# Patient Record
Sex: Male | Born: 1990 | Race: White | Hispanic: No | Marital: Married | Smoking: Never smoker
Health system: Southern US, Community
[De-identification: ages and names within clinical notes are randomized; demographics above are authoritative.]

---

## 2016-07-11 ENCOUNTER — Emergency Department (INDEPENDENT_AMBULATORY_CARE_PROVIDER_SITE_OTHER)
Admission: EM | Admit: 2016-07-11 | Discharge: 2016-07-11 | Disposition: A | Discharge status: Home / Self Care | Payer: 59 | Source: Home / Self Care | Attending: Family Medicine | Admitting: Family Medicine

## 2016-07-11 DIAGNOSIS — M79602 Pain in left arm: Secondary | ICD-10-CM | POA: Diagnosis not present

## 2016-07-11 DIAGNOSIS — M79651 Pain in right thigh: Secondary | ICD-10-CM

## 2016-07-11 DIAGNOSIS — M5412 Radiculopathy, cervical region: Secondary | ICD-10-CM

## 2016-07-11 DIAGNOSIS — S29012A Strain of muscle and tendon of back wall of thorax, initial encounter: Secondary | ICD-10-CM

## 2016-07-11 MED ORDER — MELOXICAM 15 MG PO TABS: 15.0000 mg | ORAL_TABLET | Freq: Every day | ORAL | 0 refills | Status: DC

## 2016-07-11 MED ORDER — CYCLOBENZAPRINE HCL 10 MG PO TABS
10.0000 mg | ORAL_TABLET | Freq: Two times a day (BID) | ORAL | 0 refills | Status: DC | PRN
Start: 2016-07-11 — End: 2017-08-04

## 2016-07-11 NOTE — ED Triage Notes (Signed)
Pt was working out Friday night.  Saturday morning started having pain in the neck and shoulders, and was hard to move his head. Saturday and Sunday he was unable to move his neck and head.   Monday he started having aching pain down the left arm.   Today had aching pain down the right leg.

## 2016-07-11 NOTE — ED Provider Notes (Signed)
CSN: [id number]     Arrival date & time 07/11/16  1615 History   First MD Initiated Contact with Patient 07/11/16 1651     Chief Complaint  Patient presents with  . Neck Pain  . Leg Pain  . Arm Pain   (Consider location/radiation/quality/duration/timing/severity/associated sxs/prior Treatment) HPI Bobby Morton is a 26 y.o. male presenting to UC with c/o Left sided neck and upper back soreness and stiffness along with Left arm soreness and intermittent numbness that started about 4 days ago after working out in the gym and doing overhead lifts the night before symptoms started.  Two days ago he developed Right thigh pain. Denies known injury but notes he was lifting a little more than he usually does.  He has taken ibuprofen with some relief.      History reviewed. No pertinent past medical history. History reviewed. No pertinent surgical history. History reviewed. No pertinent family history. Social History  Substance Use Topics  . Smoking status: Never Smoker  . Smokeless tobacco: Never Used  . Alcohol use Yes    Review of Systems  Musculoskeletal: Positive for arthralgias, back pain, myalgias, neck pain and neck stiffness. Negative for gait problem and joint swelling.  Skin: Negative for color change and wound.  Neurological: Positive for numbness. Negative for weakness.    Allergies  Patient has no known allergies.  Home Medications   Prior to Admission medications   Medication Sig Start Date End Date Taking? Authorizing Provider  cyclobenzaprine (FLEXERIL) 10 MG tablet Take 1 tablet (10 mg total) by mouth 2 (two) times daily as needed. 07/11/16   Junius FinnerErin O'Malley, PA-C  meloxicam (MOBIC) 15 MG tablet Take 1 tablet (15 mg total) by mouth daily. 07/11/16   Junius FinnerErin O'Malley, PA-C   Meds Ordered and Administered this Visit  Medications - No data to display  BP 129/84 (BP Location: Left Arm)   Pulse 62   Temp 98.2 F (36.8 C) (Oral)   Ht 5\' 10"  (1.778 m)   Wt 165 lb (74.8 kg)    SpO2 98%   BMI 23.68 kg/m  No data found.   Physical Exam  Constitutional: He is oriented to person, place, and time. He appears well-developed and well-nourished. No distress.  HENT:  Head: Normocephalic and atraumatic.  Eyes: EOM are normal.  Neck: Normal range of motion. Neck supple.  No midline bone tenderness, no crepitus or step-offs.  Mild tenderness to Left side cervical muscles.  Cardiovascular: Normal rate.   Pulses:      Radial pulses are 2+ on the right side, and 2+ on the left side.  Pulmonary/Chest: Effort normal.  Musculoskeletal: Normal range of motion. He exhibits tenderness. He exhibits no edema.  No midline spinal tenderness. Tenderness to Left upper trapezius.  Full ROM upper and lower extremities. Negative straight leg raise. Tenderness to Right thigh.  5/5 strength in upper and lower extremities bilaterally.   Neurological: He is alert and oriented to person, place, and time.  Skin: Skin is warm and dry. Capillary refill takes less than 2 seconds. He is not diaphoretic. No erythema.  Psychiatric: He has a normal mood and affect. His behavior is normal.  Nursing note and vitals reviewed.   Urgent Care Course     Procedures (including critical care time)  Labs Review Labs Reviewed - No data to display  Imaging Review No results found.    MDM   1. Left arm pain   2. Left cervical radiculopathy   3. Right  thigh pain   4. Muscle strain of left upper back, initial encounter    Muscle tenderness noted on exam. No bony tenderness. No red flag symptoms.  Pain likely due to muscle strain from recent workout regimen 5 days ago. Rx: Flexeril and Mobic F/u with Sports Medicine in 1 week if not improving. Patient verbalized understanding and agreement with treatment plan.    Junius Finner, PA-C 07/11/16 1835

## 2016-07-11 NOTE — Discharge Instructions (Signed)
°  Flexeril is a muscle relaxer and may cause drowsiness. Do not drink alcohol, drive, or operate heavy machinery while taking. ° °Meloxicam (Mobic) is an antiinflammatory to help with pain and inflammation.  Do not take ibuprofen, Advil, Aleve, or any other medications that contain NSAIDs while taking meloxicam as this may cause stomach upset or even ulcers if taken in large amounts for an extended period of time.  ° °

## 2017-08-04 ENCOUNTER — Emergency Department (INDEPENDENT_AMBULATORY_CARE_PROVIDER_SITE_OTHER)
Admission: EM | Admit: 2017-08-04 | Discharge: 2017-08-04 | Disposition: A | Discharge status: Home / Self Care | Payer: 59 | Source: Home / Self Care

## 2017-08-04 ENCOUNTER — Encounter: Payer: Self-pay | Admitting: Emergency Medicine

## 2017-08-04 DIAGNOSIS — J0120 Acute ethmoidal sinusitis, unspecified: Secondary | ICD-10-CM

## 2017-08-04 MED ORDER — AMOXICILLIN 500 MG PO CAPS: 500.0000 mg | ORAL_CAPSULE | Freq: Three times a day (TID) | ORAL | 0 refills | Status: DC

## 2017-08-04 NOTE — ED Provider Notes (Signed)
[facility]    CSN: [id number] Arrival date & time: 08/04/17  1124     History   Chief Complaint Chief Complaint  Patient presents with  . URI    HPI Bobby Morton is a 27 y.o. male.   The history is provided by the patient. No language interpreter was used.  URI  Presenting symptoms: congestion, cough, facial pain and rhinorrhea   Severity:  Severe Onset quality:  Gradual Duration:  11 days Timing:  Constant Progression:  Worsening Chronicity:  New Relieved by:  Nothing Worsened by:  Nothing Ineffective treatments:  None tried Associated symptoms: headaches   Risk factors: sick contacts   Risk factors: no recent illness     History reviewed. No pertinent past medical history.  There are no active problems to display for this patient.   History reviewed. No pertinent surgical history.     Home Medications    Prior to Admission medications   Medication Sig Start Date End Date Taking? Authorizing Provider  amoxicillin (AMOXIL) 500 MG capsule Take 1 capsule (500 mg total) by mouth 3 (three) times daily. 08/04/17   Elson AreasSofia, Tashona Calk K, PA-C    Family History No family history on file.  Social History Social History   Tobacco Use  . Smoking status: Never Smoker  . Smokeless tobacco: Never Used  Substance Use Topics  . Alcohol use: Yes  . Drug use: No     Allergies   Patient has no known allergies.   Review of Systems Review of Systems  HENT: Positive for congestion and rhinorrhea.   Respiratory: Positive for cough.   Neurological: Positive for headaches.  All other systems reviewed and are negative.    Physical Exam Triage Vital Signs ED Triage Vitals  Enc Vitals Group     BP 08/04/17 1159 128/88     Pulse Rate 08/04/17 1159 69     Resp --      Temp 08/04/17 1159 98.2 F (36.8 C)     Temp Source 08/04/17 1159 Oral     SpO2 08/04/17 1159 100 %     Weight 08/04/17 1200 168 lb (76.2 kg)     Height 08/04/17 1200 5' 10.5"  (1.791 m)     Head Circumference --      Peak Flow --      Pain Score 08/04/17 1200 7     Pain Loc --      Pain Edu? --      Excl. in GC? --    No data found.  Updated Vital Signs BP 128/88 (BP Location: Right Arm)   Pulse 69   Temp 98.2 F (36.8 C) (Oral)   Ht 5' 10.5" (1.791 m)   Wt 168 lb (76.2 kg)   SpO2 100%   BMI 23.76 kg/m   Visual Acuity Right Eye Distance:   Left Eye Distance:   Bilateral Distance:    Right Eye Near:   Left Eye Near:    Bilateral Near:     Physical Exam  Constitutional: He appears well-developed and well-nourished.  HENT:  Head: Normocephalic and atraumatic.  Right Ear: External ear normal.  Left Ear: External ear normal.  Mouth/Throat: Oropharynx is clear and moist.  Tender frontal and maxillary sinuses  Eyes: Conjunctivae are normal.  Neck: Neck supple.  Cardiovascular: Normal rate and regular rhythm.  No murmur heard. Pulmonary/Chest: Effort normal and breath sounds normal. No respiratory distress.  Abdominal: Soft. There is no tenderness.  Musculoskeletal: He exhibits  no edema.  Neurological: He is alert.  Skin: Skin is warm and dry.  Psychiatric: He has a normal mood and affect.  Nursing note and vitals reviewed.    UC Treatments / Results  Labs (all labs ordered are listed, but only abnormal results are displayed) Labs Reviewed - No data to display  EKG None Radiology No results found.  Procedures Procedures (including critical care time)  Medications Ordered in UC Medications - No data to display   Initial Impression / Assessment and Plan / UC Course  I have reviewed the triage vital signs and the nursing notes.  Pertinent labs & imaging results that were available during my care of the patient were reviewed by me and considered in my medical decision making (see chart for details).       Final Clinical Impressions(s) / UC Diagnoses   Final diagnoses:  Acute ethmoidal sinusitis, recurrence not specified      ED Discharge Orders        Ordered    amoxicillin (AMOXIL) 500 MG capsule  3 times daily     08/04/17 1218     An After Visit Summary was printed and given to the patient.   Controlled Substance Prescriptions [place] Controlled Substance Registry consulted? Not Applicable   Elson Areas, [place] 08/04/17 1220

## 2017-08-04 NOTE — Discharge Instructions (Addendum)
Return if any problems.

## 2017-08-04 NOTE — ED Triage Notes (Signed)
Patient complaining of productive cough, runny nose, drainage in ears, scratchy throat x 11 days, HA

## 2017-08-12 ENCOUNTER — Emergency Department (INDEPENDENT_AMBULATORY_CARE_PROVIDER_SITE_OTHER)
Admission: EM | Admit: 2017-08-12 | Discharge: 2017-08-12 | Disposition: A | Discharge status: Home / Self Care | Payer: 59 | Source: Home / Self Care | Attending: Family Medicine | Admitting: Family Medicine

## 2017-08-12 ENCOUNTER — Encounter: Payer: Self-pay | Admitting: *Deleted

## 2017-08-12 ENCOUNTER — Other Ambulatory Visit: Payer: Self-pay

## 2017-08-12 ENCOUNTER — Emergency Department (INDEPENDENT_AMBULATORY_CARE_PROVIDER_SITE_OTHER): Payer: 59

## 2017-08-12 DIAGNOSIS — R509 Fever, unspecified: Secondary | ICD-10-CM

## 2017-08-12 DIAGNOSIS — R69 Illness, unspecified: Secondary | ICD-10-CM | POA: Diagnosis not present

## 2017-08-12 DIAGNOSIS — R51 Headache: Secondary | ICD-10-CM

## 2017-08-12 DIAGNOSIS — R05 Cough: Secondary | ICD-10-CM | POA: Diagnosis not present

## 2017-08-12 DIAGNOSIS — J111 Influenza due to unidentified influenza virus with other respiratory manifestations: Secondary | ICD-10-CM

## 2017-08-12 MED ORDER — OSELTAMIVIR PHOSPHATE 75 MG PO CAPS: 75.0000 mg | ORAL_CAPSULE | Freq: Two times a day (BID) | ORAL | 0 refills | Status: DC

## 2017-08-12 MED ORDER — IBUPROFEN 600 MG PO TABS
600.0000 mg | ORAL_TABLET | Freq: Once | ORAL | Status: AC
  Administered 2017-08-12: 600 mg via ORAL

## 2017-08-12 NOTE — ED Provider Notes (Signed)
[facility]    CSN: [id number] Arrival date & time: 08/12/17  1049     History   Chief Complaint Chief Complaint  Patient presents with  . Cough  . Generalized Body Aches    HPI Bobby Morton is a 27 y.o. male.   Patient was evaluated and treated for a respiratory infection about 18 days ago and prescribed amoxicillin.  He improved, but suddenly last night developed myalgias, chills, sweats, fatigue, non-productive cough, and increased sinus drainage.  The history is provided by the patient.    History reviewed. No pertinent past medical history.  There are no active problems to display for this patient.   History reviewed. No pertinent surgical history.     Home Medications    Prior to Admission medications   Medication Sig Start Date End Date Taking? Authorizing Provider  amoxicillin (AMOXIL) 500 MG capsule Take 1 capsule (500 mg total) by mouth 3 (three) times daily. 08/04/17   Elson AreasSofia, Leslie K, PA-C  oseltamivir (TAMIFLU) 75 MG capsule Take 1 capsule (75 mg total) by mouth every 12 (twelve) hours. 08/12/17   Lattie HawBeese, Nile Prisk A, MD    Family History History reviewed. No pertinent family history.  Social History Social History   Tobacco Use  . Smoking status: Never Smoker  . Smokeless tobacco: Never Used  Substance Use Topics  . Alcohol use: Yes  . Drug use: No     Allergies   Patient has no known allergies.   Review of Systems Review of Systems No sore throat + cough No pleuritic pain No wheezing + nasal congestion + post-nasal drainage No sinus pain/pressure No itchy/red eyes No earache No hemoptysis No SOB ? fever, + chills/sweats No nausea No vomiting No abdominal pain No diarrhea No urinary symptoms No skin rash + fatigue + myalgias + headache Used OTC meds without relief   Physical Exam Triage Vital Signs ED Triage Vitals [08/12/17 1226]  Enc Vitals Group     BP 130/81     Pulse Rate 76     Resp 16     Temp 99  F (37.2 C)     Temp Source Oral     SpO2 100 %     Weight 171 lb (77.6 kg)     Height      Head Circumference      Peak Flow      Pain Score 8     Pain Loc      Pain Edu?      Excl. in GC?    No data found.  Updated Vital Signs BP 130/81 (BP Location: Right Arm)   Pulse 76   Temp 99 F (37.2 C) (Oral)   Resp 16   Wt 171 lb (77.6 kg)   SpO2 100%   BMI 24.19 kg/m   Visual Acuity Right Eye Distance:   Left Eye Distance:   Bilateral Distance:    Right Eye Near:   Left Eye Near:    Bilateral Near:     Physical Exam Nursing notes and Vital Signs reviewed. Appearance:  Patient appears stated age, and in no acute distress Eyes:  Pupils are equal, round, and reactive to light and accomodation.  Extraocular movement is intact.  Conjunctivae are not inflamed  Ears:  Canals normal.  Tympanic membranes normal.  Nose:  Mildly congested turbinates.  No sinus tenderness.   Pharynx:  Normal Neck:  Supple.  Enlarged posterior/lateral nodes are palpated bilaterally, tender to palpation on the  left.   Lungs:  Clear to auscultation.  Breath sounds are equal.  Moving air well. Heart:  Regular rate and rhythm without murmurs, rubs, or gallops.  Abdomen:  Nontender without masses or hepatosplenomegaly.  Bowel sounds are present.  No CVA or flank tenderness.  Extremities:  No edema.  Skin:  No rash present.    UC Treatments / Results  Labs (all labs ordered are listed, but only abnormal results are displayed) Labs Reviewed - No data to display  EKG None Radiology Dg Chest 2 View  Result Date: 08/12/2017 CLINICAL DATA:  27 y/o M; cough x 18 days; improved with ABT; developed chills, cough and HA. EXAM: CHEST - 2 VIEW COMPARISON:  None. FINDINGS: The heart size and mediastinal contours are within normal limits. Both lungs are clear. The visualized skeletal structures are unremarkable. IMPRESSION: No active cardiopulmonary disease. Electronically Signed   By: Mitzi Hansen  M.D.   On: 08/12/2017 13:30    Procedures Procedures (including critical care time)  Medications Ordered in UC Medications  ibuprofen (ADVIL,MOTRIN) tablet 600 mg (600 mg Oral Given 08/12/17 1229)     Initial Impression / Assessment and Plan / UC Course  I have reviewed the triage vital signs and the nursing notes.  Pertinent labs & imaging results that were available during my care of the patient were reviewed by me and considered in my medical decision making (see chart for details).    Negative chest x-ray reassuring. There is no evidence of bacterial infection today.  Begin empiric Tamiflu. Take plain guaifenesin (1200mg  extended release tabs such as Mucinex) twice daily, with plenty of water, for cough and congestion.  May add Pseudoephedrine (30mg , one or two every 4 to 6 hours) for sinus congestion.  Get adequate rest.   May take Delsym Cough Suppressant at bedtime for nighttime cough.  Try warm salt water gargles for sore throat.  Stop all antihistamines for now, and other non-prescription cough/cold preparations. May take Ibuprofen 200mg , 4 tabs every 8 hours with food for fever, headache, body aches, etc. Followup with Family Doctor if not improved in one week.     Final Clinical Impressions(s) / UC Diagnoses   Final diagnoses:  Influenza-like illness    ED Discharge Orders        Ordered    oseltamivir (TAMIFLU) 75 MG capsule  Every 12 hours     08/12/17 1403        Lattie Haw, MD 08/19/17 1414

## 2017-08-12 NOTE — Discharge Instructions (Addendum)
Take plain guaifenesin (1200mg  extended release tabs such as Mucinex) twice daily, with plenty of water, for cough and congestion.  May add Pseudoephedrine (30mg , one or two every 4 to 6 hours) for sinus congestion.  Get adequate rest.   May take Delsym Cough Suppressant at bedtime for nighttime cough.  Try warm salt water gargles for sore throat.  Stop all antihistamines for now, and other non-prescription cough/cold preparations. May take Ibuprofen 200mg , 4 tabs every 8 hours with food for fever, headache, body aches, etc.

## 2017-08-12 NOTE — ED Triage Notes (Signed)
Patient was seen 1 week ago and given Amox. Taken as directed and improved. Last night developed chills, aches HA and dry cough.

## 2017-11-06 ENCOUNTER — Other Ambulatory Visit: Payer: Self-pay

## 2017-11-06 ENCOUNTER — Emergency Department (INDEPENDENT_AMBULATORY_CARE_PROVIDER_SITE_OTHER)
Admission: EM | Admit: 2017-11-06 | Discharge: 2017-11-06 | Disposition: A | Discharge status: Home / Self Care | Payer: 59 | Source: Home / Self Care

## 2017-11-06 ENCOUNTER — Encounter: Payer: Self-pay | Admitting: *Deleted

## 2017-11-06 DIAGNOSIS — J029 Acute pharyngitis, unspecified: Secondary | ICD-10-CM | POA: Diagnosis not present

## 2017-11-06 LAB — POCT RAPID STREP A (OFFICE): Rapid Strep A Screen: NEGATIVE

## 2017-11-06 NOTE — ED Triage Notes (Signed)
Pt c/o sore throat and white spot on throat x 10 days. He also reports intermittent episodes of his heart fluttering since the sore throat began.

## 2017-11-06 NOTE — Discharge Instructions (Signed)
Schedule appointment with primary care for recheck.  Warm salt water gargles, Elevate head of bed.  Try mylanta.

## 2017-11-07 ENCOUNTER — Telehealth: Payer: Self-pay

## 2017-11-07 LAB — STREP A DNA PROBE: Group A Strep Probe: NOT DETECTED

## 2017-11-07 NOTE — ED Provider Notes (Signed)
[facility]    CSN: [id number] Arrival date & time: 11/06/17  1740     History   Chief Complaint Chief Complaint  Patient presents with  . Sore Throat    HPI Bobby Morton is a 27 y.o. male.   The history is provided by the patient. No language interpreter was used.  Sore Throat  This is a new problem. The current episode started more than 1 week ago. The problem occurs constantly. The problem has not changed since onset.Pertinent negatives include no chest pain and no abdominal pain. Nothing aggravates the symptoms. Nothing relieves the symptoms. He has tried nothing for the symptoms. The treatment provided no relief.  Pt denies fever, no cough, no runny nose.    History reviewed. No pertinent past medical history.  There are no active problems to display for this patient.   History reviewed. No pertinent surgical history.     Home Medications    Prior to Admission medications   Not on File    Family History History reviewed. No pertinent family history.  Social History Social History   Tobacco Use  . Smoking status: Never Smoker  . Smokeless tobacco: Never Used  Substance Use Topics  . Alcohol use: Yes  . Drug use: No     Allergies   Patient has no known allergies.   Review of Systems Review of Systems  Cardiovascular: Negative for chest pain.  Gastrointestinal: Negative for abdominal pain.  All other systems reviewed and are negative.    Physical Exam Triage Vital Signs ED Triage Vitals  Enc Vitals Group     BP 11/06/17 1757 (!) 146/78     Pulse Rate 11/06/17 1757 75     Resp 11/06/17 1757 16     Temp 11/06/17 1757 98.4 F (36.9 C)     Temp Source 11/06/17 1757 Oral     SpO2 11/06/17 1757 99 %     Weight 11/06/17 1758 181 lb (82.1 kg)     Height 11/06/17 1758 5\' 10"  (1.778 m)     Head Circumference --      Peak Flow --      Pain Score 11/06/17 1758 0     Pain Loc --      Pain Edu? --      Excl. in GC? --    No data  found.  Updated Vital Signs BP (!) 146/78 (BP Location: Right Arm)   Pulse 75   Temp 98.4 F (36.9 C) (Oral)   Resp 16   Ht 5\' 10"  (1.778 m)   Wt 181 lb (82.1 kg)   SpO2 99%   BMI 25.97 kg/m   Visual Acuity Right Eye Distance:   Left Eye Distance:   Bilateral Distance:    Right Eye Near:   Left Eye Near:    Bilateral Near:     Physical Exam  Constitutional: He appears well-developed and well-nourished.  HENT:  Head: Normocephalic and atraumatic.  Right Ear: Tympanic membrane normal.  Left Ear: Tympanic membrane normal.  Mouth/Throat: Mucous membranes are normal. Posterior oropharyngeal erythema present.  Eyes: Pupils are equal, round, and reactive to light. Conjunctivae are normal.  Neck: Neck supple.  Cardiovascular: Normal rate and regular rhythm.  No murmur heard. Pulmonary/Chest: Effort normal and breath sounds normal. No respiratory distress.  Abdominal: Soft. There is no tenderness.  Musculoskeletal: He exhibits no edema.  Neurological: He is alert.  Skin: Skin is warm and dry.  Psychiatric: He has a normal  mood and affect.  Nursing note and vitals reviewed.    UC Treatments / Results  Labs (all labs ordered are listed, but only abnormal results are displayed) Labs Reviewed  STREP A DNA PROBE  POCT RAPID STREP A (OFFICE)    EKG None  Radiology No results found.  Procedures Procedures (including critical care time)  Medications Ordered in UC Medications - No data to display  Initial Impression / Assessment and Plan / UC Course  I have reviewed the triage vital signs and the nursing notes.  Pertinent labs & imaging results that were available during my care of the patient were reviewed by me and considered in my medical decision making (see chart for details).   MDM  Strep screen if negative,  Pt advised symptomatic care.  Follow up with primary for recheck if symptoms persist    Final Clinical Impressions(s) / UC Diagnoses   Final  diagnoses:  Pharyngitis, unspecified etiology     Discharge Instructions     Schedule appointment with primary care for recheck.  Warm salt water gargles, Elevate head of bed.  Try mylanta.    ED Prescriptions    None    An After Visit Summary was printed and given to the patient. Controlled Substance Prescriptions [place] Controlled Substance Registry consulted? Not Applicable   Elson Areas, [place] 11/07/17 1341

## 2017-11-07 NOTE — Telephone Encounter (Signed)
Left VM with lab results and to call UC if any questions or concern.

## 2018-08-18 ENCOUNTER — Ambulatory Visit: Payer: 59 | Admitting: Osteopathic Medicine

## 2018-10-15 ENCOUNTER — Encounter: Payer: Self-pay | Admitting: Emergency Medicine

## 2018-10-15 ENCOUNTER — Other Ambulatory Visit: Payer: Self-pay

## 2018-10-15 ENCOUNTER — Emergency Department (INDEPENDENT_AMBULATORY_CARE_PROVIDER_SITE_OTHER)
Admission: EM | Admit: 2018-10-15 | Discharge: 2018-10-15 | Disposition: A | Discharge status: Home / Self Care | Payer: 59 | Source: Home / Self Care

## 2018-10-15 ENCOUNTER — Telehealth: Payer: Self-pay | Admitting: Emergency Medicine

## 2018-10-15 DIAGNOSIS — M545 Low back pain, unspecified: Secondary | ICD-10-CM

## 2018-10-15 DIAGNOSIS — R531 Weakness: Secondary | ICD-10-CM | POA: Diagnosis not present

## 2018-10-15 DIAGNOSIS — R197 Diarrhea, unspecified: Secondary | ICD-10-CM

## 2018-10-15 LAB — POCT URINALYSIS DIP (MANUAL ENTRY)
Bilirubin, UA: NEGATIVE
Blood, UA: NEGATIVE
Glucose, UA: NEGATIVE mg/dL
Ketones, POC UA: NEGATIVE mg/dL
Leukocytes, UA: NEGATIVE
Nitrite, UA: NEGATIVE
Protein Ur, POC: NEGATIVE mg/dL
Spec Grav, UA: 1.01 (ref 1.010–1.025)
Urobilinogen, UA: 0.2 E.U./dL
pH, UA: 7 (ref 5.0–8.0)

## 2018-10-15 LAB — POCT CBC W AUTO DIFF (K'VILLE URGENT CARE)

## 2018-10-15 MED ORDER — FAMOTIDINE 20 MG PO TABS
20.0000 mg | ORAL_TABLET | Freq: Once | ORAL | Status: AC
  Administered 2018-10-15: 20 mg via ORAL

## 2018-10-15 MED ORDER — AZITHROMYCIN 250 MG PO TABS: 500.0000 mg | ORAL_TABLET | Freq: Every day | ORAL | 0 refills | Status: AC

## 2018-10-15 NOTE — Telephone Encounter (Signed)
As per Andrey Campanile at [place] submit [place] top for [employer], in the past we have always submitted a sterile urine container. I advised the patient to put stool in both. He acknowledges under standing

## 2018-10-15 NOTE — ED Triage Notes (Signed)
Diarrhea x 2 days, cramps, sensitive skin, fatigue, slight headache, indigestion

## 2018-10-15 NOTE — ED Provider Notes (Signed)
[facility]    CSN: [id number] Arrival date & time: 10/15/18  1217     History   Chief Complaint Chief Complaint  Patient presents with  . Diarrhea    HPI Bobby Morton is a 28 y.o. male.   HPI  Bobby Morton is a 28 y.o. male presenting to UC with c/o mild diffuse abdominal cramping with generalized fatigue for about 3-4 days and gradually worsening diarrhea the last 2-3 days.  Yesterday he reports "over 20 episodes" of watery diarrhea w/o blood or mucous in the stool. This morning he has already had about 5 episodes of diarrhea.  Abdominal cramping comes in waves and improves after he has a bowel movement but never completely resolves. Denies fever, chills, nausea or vomiting but does have a mild headache, bilateral low back pain and indigestion.  No medications tried PTA. No known sick contacts or recent travel. No recent antibiotic use. He cannot recall any bad food he has eaten.    History reviewed. No pertinent past medical history.  There are no active problems to display for this patient.   History reviewed. No pertinent surgical history.     Home Medications    Prior to Admission medications   Medication Sig Start Date End Date Taking? Authorizing Provider  azithromycin (ZITHROMAX) 250 MG tablet Take 2 tablets (500 mg total) by mouth daily for 3 days. Take with food 10/15/18 10/18/18  Lurene Shadow, PA-C    Family History Family History  Problem Relation Age of Onset  . Healthy Mother   . Healthy Father     Social History Social History   Tobacco Use  . Smoking status: Never Smoker  . Smokeless tobacco: Never Used  Substance Use Topics  . Alcohol use: Yes  . Drug use: No     Allergies   Patient has no known allergies.   Review of Systems Review of Systems  Constitutional: Negative for chills and fever.  HENT: Negative for congestion, ear pain, sore throat, trouble swallowing and voice change.   Respiratory: Negative for cough and  shortness of breath.   Cardiovascular: Negative for chest pain and palpitations.  Gastrointestinal: Positive for abdominal pain and diarrhea. Negative for nausea and vomiting.  Musculoskeletal: Positive for back pain. Negative for arthralgias and myalgias.  Skin: Negative for rash.  Neurological: Positive for weakness and headaches. Negative for dizziness, syncope and light-headedness.     Physical Exam Triage Vital Signs ED Triage Vitals  Enc Vitals Group     BP 10/15/18 1229 128/85     Pulse Rate 10/15/18 1229 82     Resp --      Temp 10/15/18 1229 98.9 F (37.2 C)     Temp Source 10/15/18 1229 Oral     SpO2 10/15/18 1229 99 %     Weight 10/15/18 1230 180 lb (81.6 kg)     Height 10/15/18 1230 5\' 10"  (1.778 m)     Head Circumference --      Peak Flow --      Pain Score 10/15/18 1229 4     Pain Loc --      Pain Edu? --      Excl. in GC? --    No data found.  Updated Vital Signs BP 128/85 (BP Location: Right Arm)   Pulse 82   Temp 98.9 F (37.2 C) (Oral)   Ht 5\' 10"  (1.778 m)   Wt 180 lb (81.6 kg)   SpO2 99%  BMI 25.83 kg/m   Visual Acuity Right Eye Distance:   Left Eye Distance:   Bilateral Distance:    Right Eye Near:   Left Eye Near:    Bilateral Near:     Physical Exam Vitals signs and nursing note reviewed.  Constitutional:      Appearance: Normal appearance. He is well-developed.  HENT:     Head: Normocephalic and atraumatic.     Right Ear: Tympanic membrane normal.     Left Ear: Tympanic membrane normal.     Nose: Nose normal.     Mouth/Throat:     Lips: Pink.     Mouth: Mucous membranes are moist.     Pharynx: Oropharynx is clear. Uvula midline.  Neck:     Musculoskeletal: Normal range of motion.  Cardiovascular:     Rate and Rhythm: Normal rate and regular rhythm.  Pulmonary:     Effort: Pulmonary effort is normal.     Breath sounds: Normal breath sounds.  Abdominal:     General: There is no distension.     Palpations: Abdomen is soft.      Tenderness: There is no abdominal tenderness. There is no right CVA tenderness or left CVA tenderness.  Musculoskeletal: Normal range of motion.  Skin:    General: Skin is warm and dry.  Neurological:     Mental Status: He is alert and oriented to person, place, and time.  Psychiatric:        Behavior: Behavior normal.      UC Treatments / Results  Labs (all labs ordered are listed, but only abnormal results are displayed) Labs Reviewed  COMPLETE METABOLIC PANEL WITH GFR  GASTROINTESTINAL PATHOGEN PANEL PCR  POCT CBC W AUTO DIFF ([facility])  POCT URINALYSIS DIP (MANUAL ENTRY)    EKG None  Radiology No results found.  Procedures Procedures (including critical care time)  Medications Ordered in UC Medications  famotidine (PEPCID) tablet 20 mg (20 mg Oral Given 10/15/18 1304)    Initial Impression / Assessment and Plan / UC Course  I have reviewed the triage vital signs and the nursing notes.  Pertinent labs & imaging results that were available during my care of the patient were reviewed by me and considered in my medical decision making (see chart for details).     Suspect infectious diarrhea. Benign abdominal exam. No evidence of emergent process taking place at this time.  Pt unable to provide stool sample in clinic, will have him return as soon as he can. Home care info provided. Discussed symptoms that warrant emergent care in the ED.  Final Clinical Impressions(s) / UC Diagnoses   Final diagnoses:  Diarrhea of presumed infectious origin  Generalized weakness  Acute bilateral low back pain without sciatica     Discharge Instructions      Be sure to get a lot of rest and stay well hydrated with sports drinks, water, diluted juices, and clear sodas.  Avoid fried fatty food, spicy food, and milk as these foods can cause worsening stomach upset.   Please follow up with family medicine in 3-4 days if not improving.  Call 911 or have someone  drive you to the hospital if symptoms significantly worsening- severe abdominal pain, blood in stool, dizziness/passing out, or other new concerning symptoms develop.    ED Prescriptions    Medication Sig Dispense Auth. Provider   azithromycin (ZITHROMAX) 250 MG tablet Take 2 tablets (500 mg total) by mouth daily for 3 days.  Take with food 6 tablet Lurene ShadowPhelps, Batsheva Stevick O, PA-C     Controlled Substance Prescriptions [place] Controlled Substance Registry consulted? Not Applicable   Rolla Platehelps, Jayesh Marbach O, PA-C 10/15/18 1419

## 2018-10-15 NOTE — Discharge Instructions (Signed)
°  Be sure to get a lot of rest and stay well hydrated with sports drinks, water, diluted juices, and clear sodas.  Avoid fried fatty food, spicy food, and milk as these foods can cause worsening stomach upset.   Please follow up with family medicine in 3-4 days if not improving.  Call 911 or have someone drive you to the hospital if symptoms significantly worsening- severe abdominal pain, blood in stool, dizziness/passing out, or other new concerning symptoms develop.

## 2018-10-16 LAB — COMPLETE METABOLIC PANEL WITH GFR
AG Ratio: 1.5 (calc) (ref 1.0–2.5)
ALT: 44 U/L (ref 9–46)
AST: 25 U/L (ref 10–40)
Albumin: 4.5 g/dL (ref 3.6–5.1)
Alkaline phosphatase (APISO): 52 U/L (ref 36–130)
BUN: 14 mg/dL (ref 7–25)
CO2: 26 mmol/L (ref 20–32)
Calcium: 9.2 mg/dL (ref 8.6–10.3)
Chloride: 104 mmol/L (ref 98–110)
Creat: 1 mg/dL (ref 0.60–1.35)
GFR, Est African American: 117 mL/min/{1.73_m2} (ref >=60)
GFR, Est Non African American: 101 mL/min/{1.73_m2} (ref >=60)
Globulin: 3 g/dL (calc) (ref 1.9–3.7)
Glucose, Bld: 86 mg/dL (ref 65–99)
Potassium: 4 mmol/L (ref 3.5–5.3)
Sodium: 140 mmol/L (ref 135–146)
Total Bilirubin: 0.6 mg/dL (ref 0.2–1.2)
Total Protein: 7.5 g/dL (ref 6.1–8.1)

## 2018-10-16 LAB — GASTROINTESTINAL PATHOGEN PANEL PCR
C. difficile Tox A/B, PCR: NOT DETECTED
Campylobacter, PCR: NOT DETECTED
Cryptosporidium, PCR: NOT DETECTED
E coli (ETEC) LT/ST PCR: NOT DETECTED
E coli (STEC) stx1/stx2, PCR: NOT DETECTED
E coli 0157, PCR: NOT DETECTED
Giardia lamblia, PCR: NOT DETECTED
Norovirus, PCR: NOT DETECTED
Rotavirus A, PCR: NOT DETECTED
Salmonella, PCR: NOT DETECTED
Shigella, PCR: NOT DETECTED

## 2018-10-17 ENCOUNTER — Telehealth: Payer: Self-pay

## 2018-10-17 NOTE — Telephone Encounter (Signed)
Spoke with patient and gave negative results of stool culture.  Still having diarrhea, encouraged to follow up with PCP.

## 2019-06-02 IMAGING — DX DG CHEST 2V
2 series · 2 of 2 positions shown · non-contrast
Comparison: None.

CLINICAL DATA: 27 y/o M; cough x 18 days; improved with ABT;
developed chills, cough and HA.

EXAM:
CHEST - 2 VIEW

[chest pa]
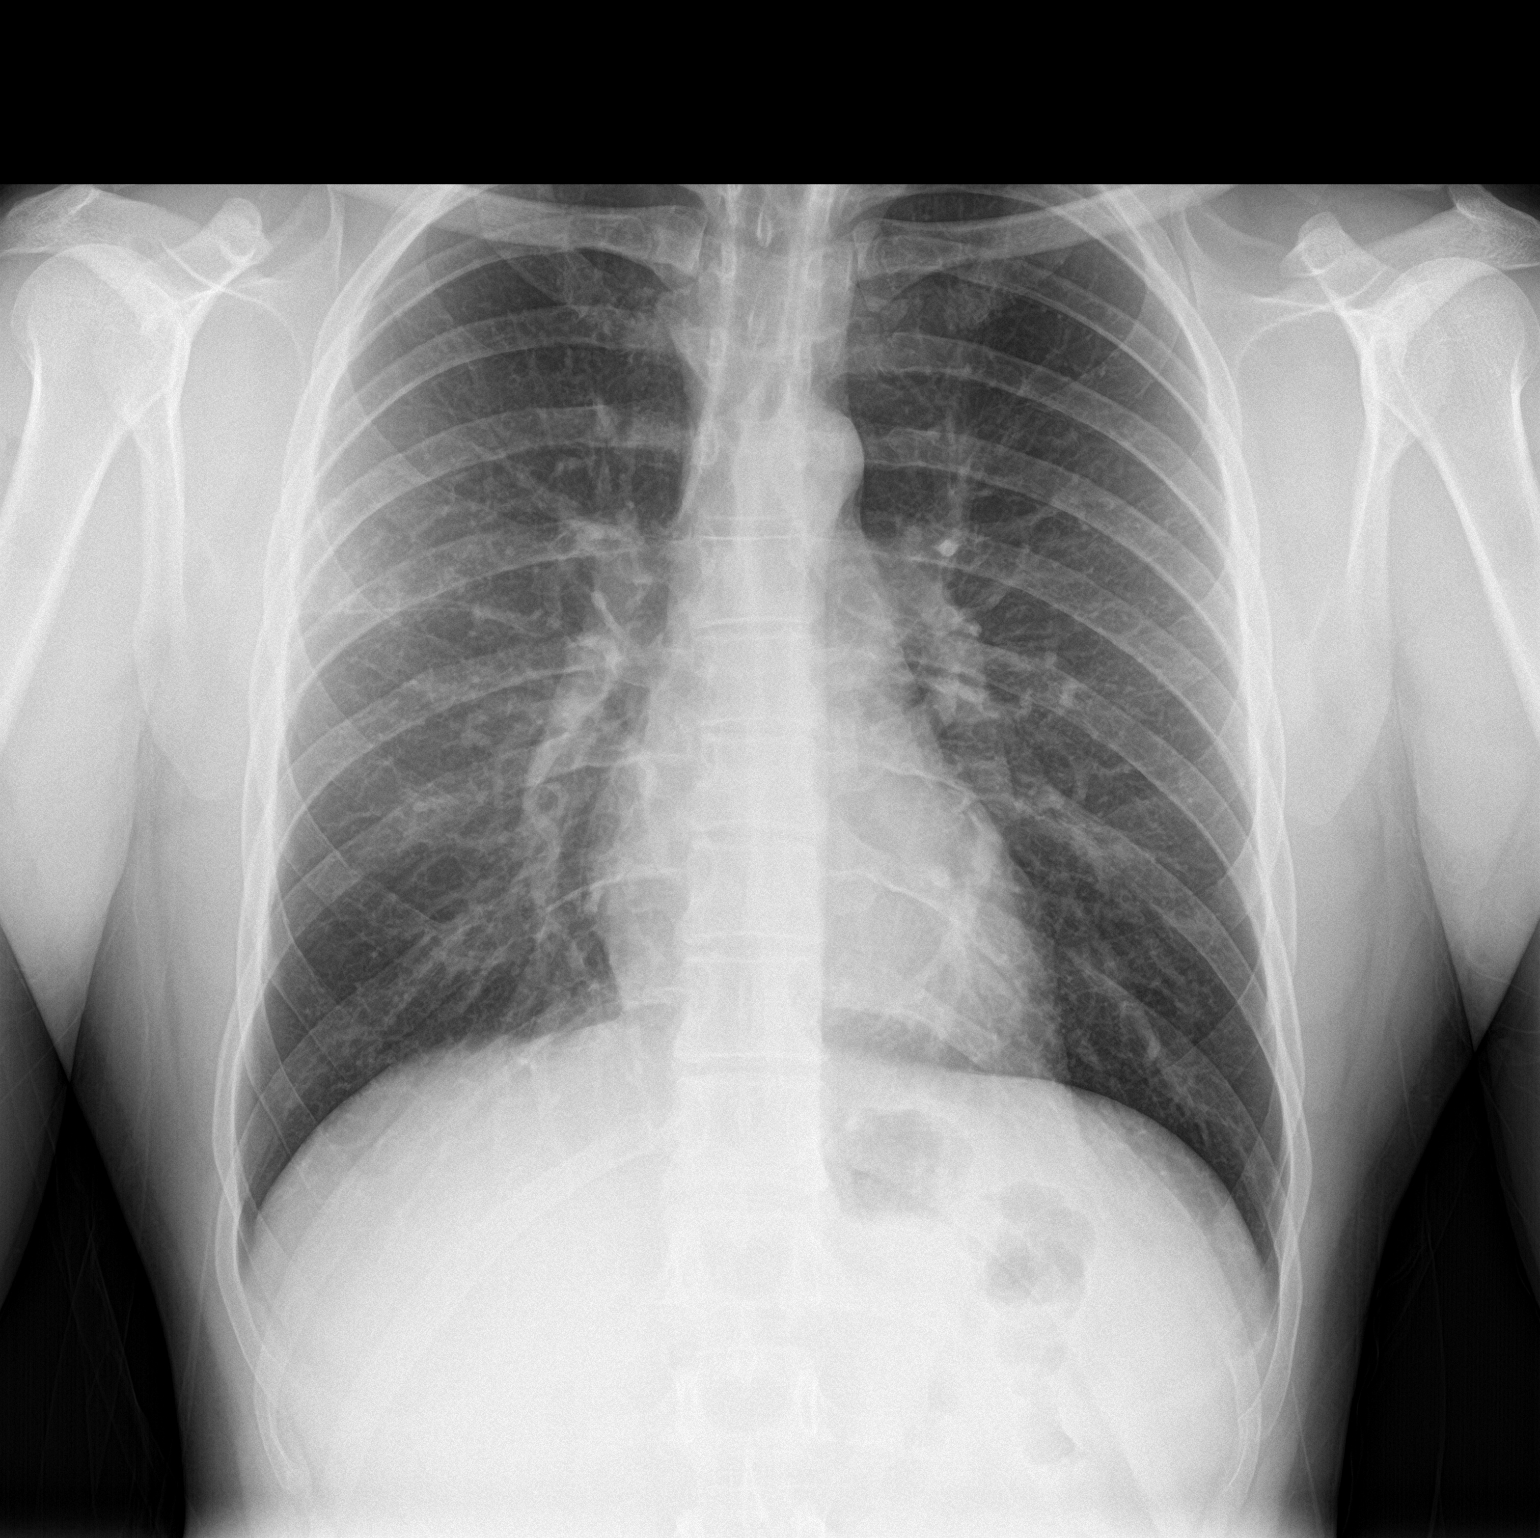

[chest lat]
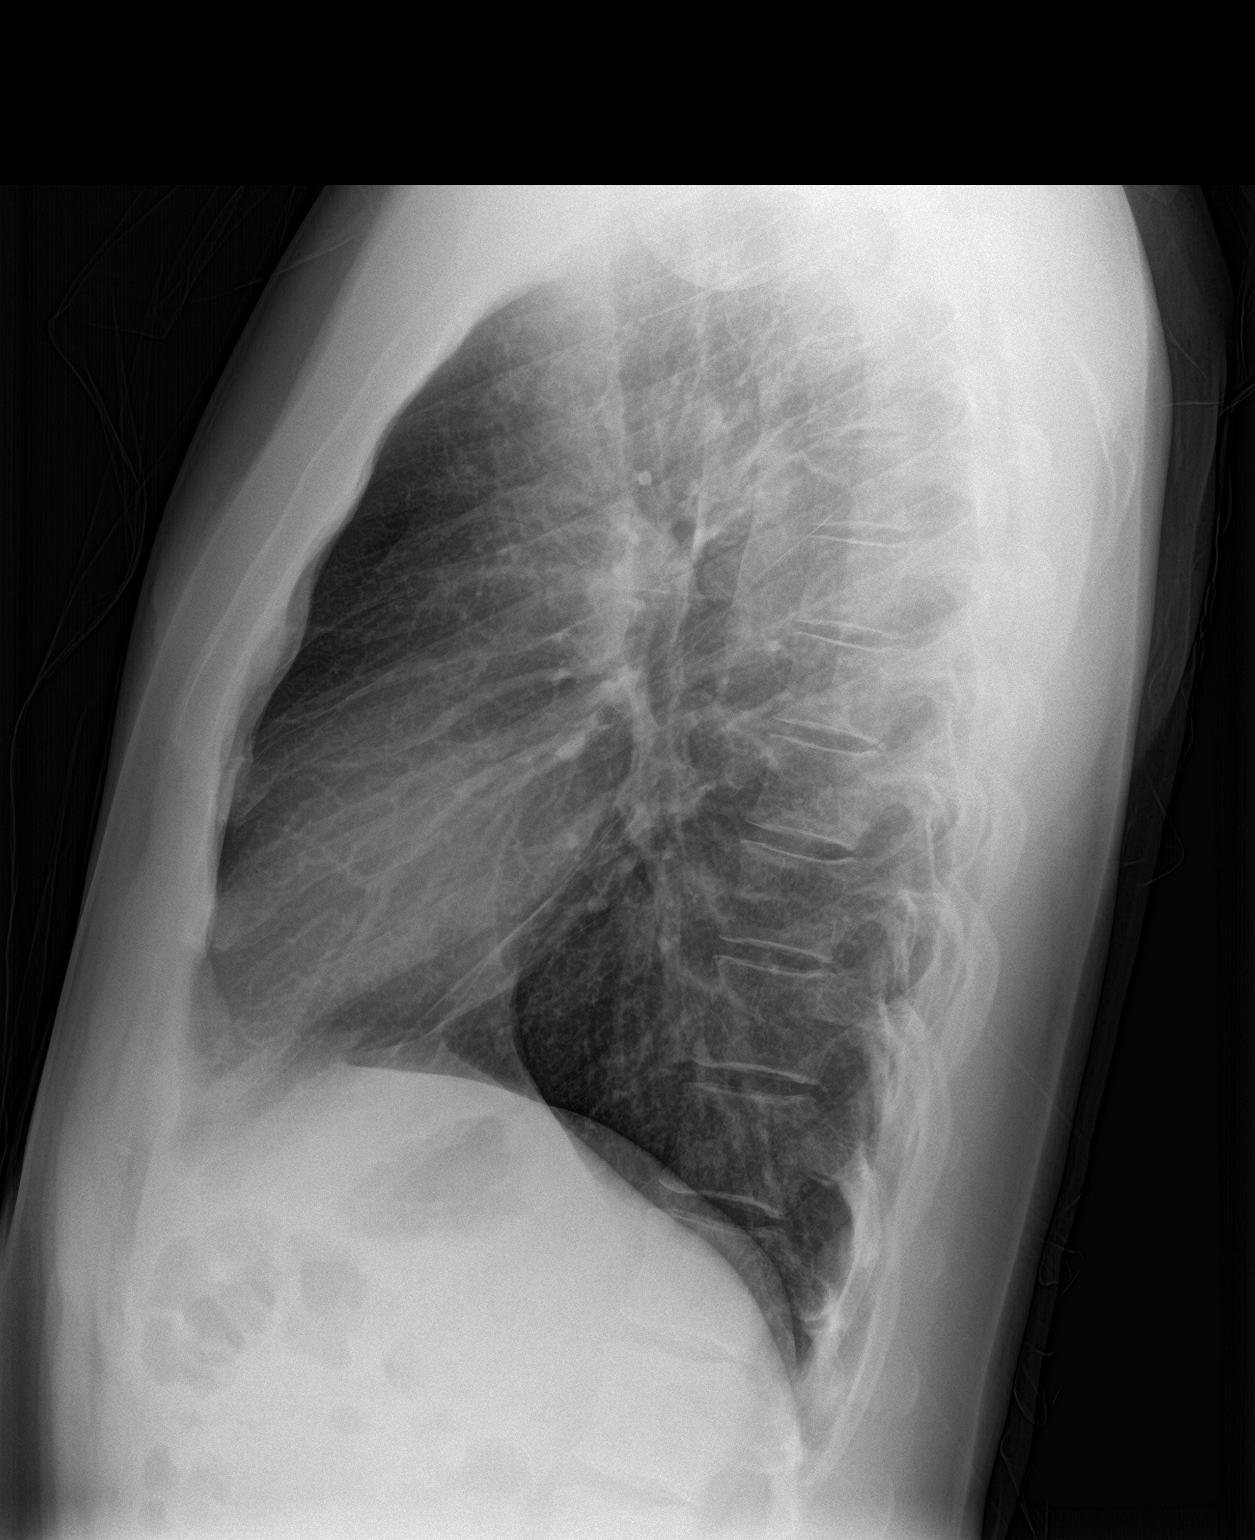

[2 of 2 positions shown; findings below may reference images not displayed]

FINDINGS: The heart size and mediastinal contours are within normal limits.
Both lungs are clear. The visualized skeletal structures are
unremarkable.
IMPRESSION: No active cardiopulmonary disease.

By: Petit Day Serena M.D.
